# Patient Record
Sex: Male | Born: 1970 | Race: Black or African American | Hispanic: No | Marital: Married | Smoking: Former smoker
Health system: Southern US, Community
[De-identification: ages and names within clinical notes are randomized; demographics above are authoritative.]

## PROBLEM LIST (undated history)

## (undated) DIAGNOSIS — I639 Cerebral infarction, unspecified: Secondary | ICD-10-CM

## (undated) DIAGNOSIS — E785 Hyperlipidemia, unspecified: Secondary | ICD-10-CM

## (undated) HISTORY — DX: Cerebral infarction, unspecified: I63.9

---

## 1999-10-29 ENCOUNTER — Emergency Department (HOSPITAL_COMMUNITY): Admission: EM | Admit: 1999-10-29 | Discharge: 1999-10-29 | Payer: Self-pay | Admitting: Emergency Medicine

## 1999-10-30 ENCOUNTER — Ambulatory Visit (HOSPITAL_COMMUNITY): Admission: RE | Admit: 1999-10-30 | Discharge: 1999-10-30 | Payer: Self-pay | Admitting: Emergency Medicine

## 1999-10-30 ENCOUNTER — Encounter: Payer: Self-pay | Admitting: Emergency Medicine

## 1999-11-08 ENCOUNTER — Ambulatory Visit: Admission: RE | Admit: 1999-11-08 | Discharge: 1999-11-08 | Payer: Self-pay | Admitting: Internal Medicine

## 2013-01-07 ENCOUNTER — Other Ambulatory Visit: Payer: Self-pay | Admitting: Sports Medicine

## 2013-01-07 DIAGNOSIS — M542 Cervicalgia: Secondary | ICD-10-CM

## 2013-01-11 ENCOUNTER — Ambulatory Visit
Admission: RE | Admit: 2013-01-11 | Discharge: 2013-01-11 | Disposition: A | Discharge status: Home / Self Care | Payer: No Typology Code available for payment source | Source: Ambulatory Visit | Attending: Sports Medicine | Admitting: Sports Medicine

## 2013-01-11 DIAGNOSIS — M542 Cervicalgia: Secondary | ICD-10-CM

## 2020-10-16 ENCOUNTER — Emergency Department (HOSPITAL_COMMUNITY): Payer: No Typology Code available for payment source

## 2020-10-16 ENCOUNTER — Encounter (HOSPITAL_COMMUNITY): Payer: Self-pay | Admitting: Emergency Medicine

## 2020-10-16 ENCOUNTER — Emergency Department (HOSPITAL_COMMUNITY)
Admission: EM | Admit: 2020-10-16 | Discharge: 2020-10-16 | Disposition: A | Discharge status: Home / Self Care | Payer: No Typology Code available for payment source | Attending: Emergency Medicine | Admitting: Emergency Medicine

## 2020-10-16 DIAGNOSIS — N451 Epididymitis: Secondary | ICD-10-CM

## 2020-10-16 HISTORY — DX: Hyperlipidemia, unspecified: E78.5

## 2020-10-16 LAB — COMPREHENSIVE METABOLIC PANEL
ALT: 44 U/L (ref 0–44)
AST: 35 U/L (ref 15–41)
Albumin: 3.8 g/dL (ref 3.5–5.0)
Alkaline Phosphatase: 113 U/L (ref 38–126)
Anion gap: 12 (ref 5–15)
BUN: 11 mg/dL (ref 6–20)
CO2: 23 mmol/L (ref 22–32)
Calcium: 9.4 mg/dL (ref 8.9–10.3)
Chloride: 100 mmol/L (ref 98–111)
Creatinine, Ser: 1.16 mg/dL (ref 0.61–1.24)
GFR, Estimated: >60 mL/min (ref >60)
Glucose, Bld: 111 mg/dL — ABNORMAL HIGH (ref 70–99)
Potassium: 3.4 mmol/L — ABNORMAL LOW (ref 3.5–5.1)
Sodium: 135 mmol/L (ref 135–145)
Total Bilirubin: 0.7 mg/dL (ref 0.3–1.2)
Total Protein: 7.6 g/dL (ref 6.5–8.1)

## 2020-10-16 LAB — CBC
HCT: 37.9 % — ABNORMAL LOW (ref 39.0–52.0)
Hemoglobin: 13.2 g/dL (ref 13.0–17.0)
MCH: 30.8 pg (ref 26.0–34.0)
MCHC: 34.8 g/dL (ref 30.0–36.0)
MCV: 88.3 fL (ref 80.0–100.0)
Platelets: 253 10*3/uL (ref 150–400)
RBC: 4.29 MIL/uL (ref 4.22–5.81)
RDW: 13.1 % (ref 11.5–15.5)
WBC: 8.9 10*3/uL (ref 4.0–10.5)
nRBC: 0 % (ref 0.0–0.2)

## 2020-10-16 MED ORDER — NAPROXEN 500 MG PO TABS: 500.0000 mg | ORAL_TABLET | Freq: Two times a day (BID) | ORAL | 0 refills | Status: AC

## 2020-10-16 MED ORDER — LEVOFLOXACIN 500 MG PO TABS: 500.0000 mg | ORAL_TABLET | Freq: Every day | ORAL | 0 refills | Status: DC

## 2020-10-16 NOTE — ED Provider Notes (Signed)
[facility] EMERGENCY DEPARTMENT Provider Note   CSN: [id number] Arrival date & time: 10/16/20  1000     History Chief Complaint  Patient presents with  . Testicle Pain    Spencer Turner is a 50 y.o. male with no pertinent past medical to the presents the emerge department today for testicle pain.  Patient states that he was diagnosed with epididymitis at [facility] on Friday, 4 days ago after he realized he had a left testicle pain and left testicle swelling that day.  He states that they checked him for STDs which were negative according to pt, was discharged on doxycycline for epididymitis. Patient states that he had 5 doses of his doxycycline, however the pain and swelling have not improved, still having severe pain in his left testicle.  States that they gave him pain medication to take during the day which has been helping, however the swelling has remained the same.  Denies any fevers, chills, nausea, vomiting, abdominal pain.  Pain does not radiate anywhere.  Denies any dysuria or hematuria.  Denies any penile discharge or lesions.  Denies any trauma to the area.  HPI     Past Medical History:  Diagnosis Date  . Hyperlipemia     There are no problems to display for this patient.     No family history on file.     Home Medications Prior to Admission medications   Medication Sig Start Date End Date Taking? Authorizing Provider  levofloxacin (LEVAQUIN) 500 MG tablet Take 1 tablet (500 mg total) by mouth daily. 10/16/20  Yes Farrel Gordon, PA-C  naproxen (NAPROSYN) 500 MG tablet Take 1 tablet (500 mg total) by mouth 2 (two) times daily. 10/16/20  Yes Farrel Gordon, PA-C    Allergies    Patient has no known allergies.  Review of Systems   Review of Systems  Constitutional: Negative for chills, diaphoresis, fatigue and fever.  HENT: Negative for congestion, sore throat and trouble swallowing.   Eyes: Negative for pain and visual disturbance.   Respiratory: Negative for cough, shortness of breath and wheezing.   Cardiovascular: Negative for chest pain, palpitations and leg swelling.  Gastrointestinal: Negative for abdominal distention, abdominal pain, diarrhea, nausea and vomiting.  Genitourinary: Positive for scrotal swelling and testicular pain. Negative for difficulty urinating, flank pain, hematuria, penile discharge, penile pain and penile swelling.  Musculoskeletal: Negative for back pain, neck pain and neck stiffness.  Skin: Negative for pallor.  Neurological: Negative for dizziness, speech difficulty, weakness and headaches.  Psychiatric/Behavioral: Negative for confusion.    Physical Exam Updated Vital Signs BP (!) 143/106 (BP Location: Left Arm)   Pulse 71   Temp 98.5 F (36.9 C)   Resp 14   SpO2 91%   Physical Exam Exam conducted with a chaperone present.  Constitutional:      General: He is not in acute distress.    Appearance: Normal appearance. He is not ill-appearing, toxic-appearing or diaphoretic.  HENT:     Mouth/Throat:     Mouth: Mucous membranes are moist.     Pharynx: Oropharynx is clear.  Eyes:     General: No scleral icterus.    Extraocular Movements: Extraocular movements intact.     Pupils: Pupils are equal, round, and reactive to light.  Cardiovascular:     Rate and Rhythm: Normal rate and regular rhythm.     Pulses: Normal pulses.     Heart sounds: Normal heart sounds.  Pulmonary:     Effort:  Pulmonary effort is normal. No respiratory distress.     Breath sounds: Normal breath sounds. No stridor. No wheezing, rhonchi or rales.  Chest:     Chest wall: No tenderness.  Abdominal:     General: Abdomen is flat. There is no distension.     Palpations: Abdomen is soft.     Tenderness: There is no abdominal tenderness. There is no guarding or rebound.     Hernia: There is no hernia in the left inguinal area or right inguinal area.  Genitourinary:    Penis: Normal.      Testes:  Cremasteric reflex is present.        Left: Tenderness and swelling present.     Epididymis:     Right: Normal.     Left: Tenderness present.       Comments: Left testicle is swollen and 3 times the size of his right testicle.  Tenderness to palpation diffusely on left testicle, worse at epididymis.  No erythema.  Cremaster reflex present. Musculoskeletal:        General: No swelling or tenderness. Normal range of motion.     Cervical back: Normal range of motion and neck supple. No rigidity.     Right lower leg: No edema.     Left lower leg: No edema.  Lymphadenopathy:     Lower Body: No right inguinal adenopathy. No left inguinal adenopathy.  Skin:    General: Skin is warm and dry.     Capillary Refill: Capillary refill takes less than 2 seconds.     Coloration: Skin is not pale.  Neurological:     General: No focal deficit present.     Mental Status: He is alert and oriented to person, place, and time.  Psychiatric:        Mood and Affect: Mood normal.        Behavior: Behavior normal.     ED Results / Procedures / Treatments   Labs (all labs ordered are listed, but only abnormal results are displayed) Labs Reviewed  CBC - Abnormal; Notable for the following components:      Result Value   HCT 37.9 (*)    All other components within normal limits  COMPREHENSIVE METABOLIC PANEL - Abnormal; Notable for the following components:   Potassium 3.4 (*)    Glucose, Bld 111 (*)    All other components within normal limits    EKG None  Radiology US SCROTUM W/DOPPLER  Result Date: 10/16/2020 CLINICAL DATA:  Left scrotal pain for 3 days.  No reported injury. EXAM: SCROTAL ULTRASOUND DOPPLER ULTRASOUND OF THE TESTICLES TECHNIQUE: Complete ultrasound examination of the testicles, epididymis, and other scrotal structures was performed. Color and spectral Doppler ultrasound were also utilized to evaluate blood flow to the testicles. COMPARISON:  None. FINDINGS: Right testicle  Measurements: 4.5 x 2.5 x 3.4 cm. No mass or microlithiasis visualized. Left testicle Measurements: 4.3 x 2.7 x 3.9 cm. No mass or microlithiasis visualized. Right epididymis:  Normal in size and appearance. Left epididymis: Diffusely thickened, heterogeneous and hypervascular left epididymis compatible with acute left epididymitis. No discrete abscess. Hydrocele: Small to moderate left hydrocele with a few thin internal septations. Varicocele:  None visualized. Pulsed Doppler interrogation of both testes demonstrates normal low resistance arterial and venous waveforms bilaterally. IMPRESSION: 1. Prominent acute left epididymitis. Small to moderate left hydrocele with a few thin internal septations. 2. Normal testes.  No testicular masses.  No testicular torsion. Electronically Signed   By: Barbara Cower  A Poff M.D.   On: 10/16/2020 12:49    Procedures Procedures   Medications Ordered in ED Medications - No data to display  ED Course  I have reviewed the triage vital signs and the nursing notes.  Pertinent labs & imaging results that were available during my care of the patient were reviewed by me and considered in my medical decision making (see chart for details).    MDM Rules/Calculators/A&P                         Donat TIMMY BUBECK is a 50 y.o. male with no pertinent past medical to the presents the emerge department today for testicle pain.Ultrasound from [facility] shows normal testicle size on the right and left sides, however on my exam left testicle looks 3 times a size is right.  This could be consistent with variceal, however since there is no improvement with doxycycline I think is reasonable to get another ultrasound at this time to assess for worsening disease.  No concerns of cellulitis.  Did not palpate abscess.  Work-up today without any leukocytosis, afebrile.  Ultrasound does show acute left epididymitis, did speak to Jonny Ruiz, pharmacist who recommends changing patient's medications and  Levaquin at this time.  Patient will follow up with urology.  Patient agreeable with plan.  Doubt need for further emergent work up at this time. I explained the diagnosis and have given explicit precautions to return to the ER including for any other new or worsening symptoms. The patient understands and accepts the medical plan as it's been dictated and I have answered their questions. Discharge instructions concerning home care and prescriptions have been given. The patient is STABLE and is discharged to home in good condition.   Final Clinical Impression(s) / ED Diagnoses Final diagnoses:  Epididymitis    Rx / DC Orders ED Discharge Orders         Ordered    levofloxacin (LEVAQUIN) 500 MG tablet  Daily        10/16/20 1318    naproxen (NAPROSYN) 500 MG tablet  2 times daily        10/16/20 1319           Farrel Gordon, PA-C 10/16/20 1600    Alvira Monday, MD 10/18/20 [phone]

## 2020-10-16 NOTE — ED Triage Notes (Addendum)
Pt began having left testicle pain and swelling on Friday 3/19 he was seen at [facility] had a [place] was diagnosed with epididymis and started on doxy. He is here today because pain or swelling has not improved.

## 2020-10-16 NOTE — Discharge Instructions (Signed)
Please discontinue taking her doxycycline, start taking your Levaquin. If you have any new or worsening concerning symptoms is back to the emergency department.  As we discussed I want you to follow-up with your urologist.  Please see the attached directions.  You can take naproxen as directed on the bottle for pain and anti-inflammatory.

## 2020-10-18 ENCOUNTER — Ambulatory Visit (INDEPENDENT_AMBULATORY_CARE_PROVIDER_SITE_OTHER): Payer: Commercial Managed Care - PPO | Admitting: Urology

## 2020-10-18 ENCOUNTER — Encounter: Payer: Self-pay | Admitting: Urology

## 2020-10-18 ENCOUNTER — Other Ambulatory Visit: Payer: Self-pay

## 2020-10-18 VITALS — BP 125/81 | HR 99 | Ht 70.0 in | Wt 255.0 lb

## 2020-10-18 DIAGNOSIS — N50819 Testicular pain, unspecified: Secondary | ICD-10-CM | POA: Diagnosis not present

## 2020-10-18 NOTE — Progress Notes (Signed)
   10/18/2020 9:36 AM   Spencer Turner 1971-03-28 [id number]  Referring provider: Nathaneil Canary, PA-C [address] [place],  [place] [address]  Chief Complaint  Patient presents with  . Other    HPI: Spencer Turner is a 50 y.o. male who presents for follow-up of a recent ED visit for left epididymitis.   Onset of left hemiscrotal pain and swelling 10/13/2020  Was seen at [facility] ED in [place]  Negative UA and STI testing  Scrotal sonogram consistent with left epididymitis  Received IM Rocephin and discharged on 10-day course of doxycycline  Went to [facility] ED 10/16/2020 for persistent pain and swelling  Exam remarkable for left testis 3 times normal size  Scrotal ultrasound repeated which showed diffusely thickened and hypervascular left epididymis; no evidence of abscess  Antibiotic was changed to Levaquin  No bothersome LUTS  No prior history epididymitis  States his pain has significantly improved over the last 48 hours   PMH: Past Medical History:  Diagnosis Date  . Hyperlipemia     Surgical History: . Fracture surgery Left ankle  . Tonsillectomy    Home Medications:  Allergies as of 10/18/2020   No Known Allergies     Medication List       Accurate as of October 18, 2020  9:36 AM. If you have any questions, ask your nurse or doctor.        levofloxacin 500 MG tablet Commonly known as: LEVAQUIN Take 1 tablet (500 mg total) by mouth daily.   naproxen 500 MG tablet Commonly known as: NAPROSYN Take 1 tablet (500 mg total) by mouth 2 (two) times daily.       Allergies: No Known Allergies  Family History: No family history on file.  Social History:  reports that he has been smoking. He has never used smokeless tobacco. He reports current alcohol use. No history on file for drug use.   Physical Exam: BP 125/81   Pulse 99   Ht 5\' 10"  (1.778 m)   Wt 255 lb (115.7 kg)   BMI 36.59 kg/m   Constitutional:  Alert  and oriented, No acute distress. HEENT: [place] AT, moist mucus membranes.  Trachea midline, no masses. Cardiovascular: No clubbing, cyanosis, or edema. Respiratory: Normal respiratory effort, no increased work of breathing. GU: Phallus without lesions, left hemiscrotum enlarged without erythema or fluctuance enlargement induration left epididymis and testis; minimal tenderness Skin: No rashes, bruises or suspicious lesions. Neurologic: Grossly intact, no focal deficits, moving all 4 extremities. Psychiatric: Normal mood and affect.  Laboratory Data:  Urinalysis Dipstick/microscopy negative  Pertinent Imaging: Scrotal ultrasound images 10/16/2020 personally reviewed and interpreted  Assessment & Plan:    1.  Left epididymo-orchitis  Significant symptom improvement past 48 hours  No evidence scrotal abscess  Complete Levaquin course  Will treat with an additional 10-day course Septra DS after Levaquin completed  Follow-up 6-8 weeks for reexam   10/18/2020, MD  [facility] [address], [address] [place], [place] [phone]

## 2020-10-19 LAB — URINALYSIS, COMPLETE
Bilirubin, UA: NEGATIVE
Glucose, UA: NEGATIVE
Ketones, UA: NEGATIVE
Leukocytes,UA: NEGATIVE
Nitrite, UA: NEGATIVE
Protein,UA: NEGATIVE
RBC, UA: NEGATIVE
Specific Gravity, UA: >1.03 — ABNORMAL HIGH (ref 1.005–1.030)
Urobilinogen, Ur: 1 mg/dL (ref 0.2–1.0)
pH, UA: 5 (ref 5.0–7.5)

## 2020-10-19 LAB — MICROSCOPIC EXAMINATION: Bacteria, UA: NONE SEEN

## 2020-10-22 MED ORDER — SULFAMETHOXAZOLE-TRIMETHOPRIM 800-160 MG PO TABS: 1.0000 | ORAL_TABLET | Freq: Two times a day (BID) | ORAL | 0 refills | Status: AC

## 2020-12-11 ENCOUNTER — Ambulatory Visit: Payer: Self-pay | Admitting: Urology

## 2020-12-12 ENCOUNTER — Encounter: Payer: Self-pay | Admitting: Urology

## 2022-04-03 IMAGING — US US SCROTUM W/ DOPPLER COMPLETE
1 series · 13 of 25 positions shown · non-contrast
Comparison: None.

CLINICAL DATA: Left scrotal pain for 3 days.  No reported injury.

EXAM:
SCROTAL ULTRASOUND
DOPPLER ULTRASOUND OF THE TESTICLES
TECHNIQUE: Complete ultrasound examination of the testicles, epididymis, and
other scrotal structures was performed. Color and spectral Doppler
ultrasound were also utilized to evaluate blood flow to the
testicles.

[Series 1: us scrotum w/doppler · 13 of 65 slices shown]
[im 1/65]
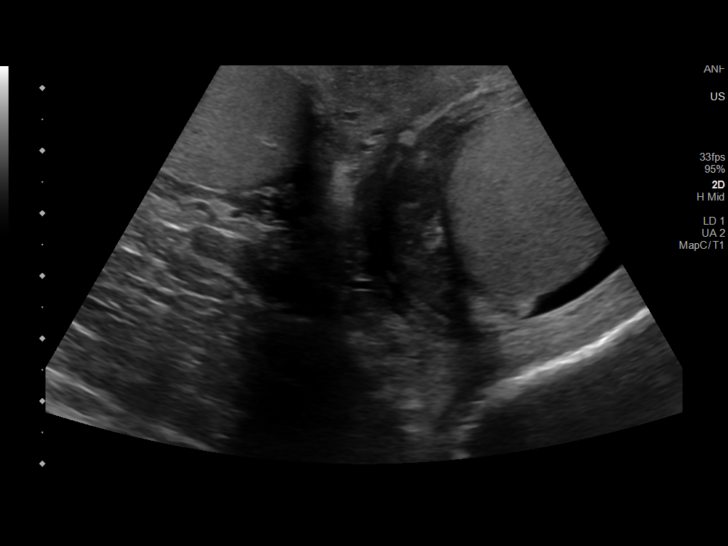
[im 6/65]
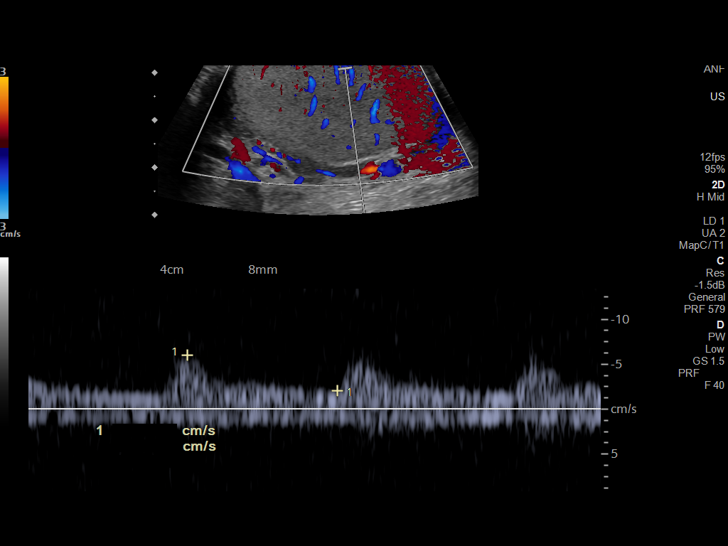
[im 11/65]
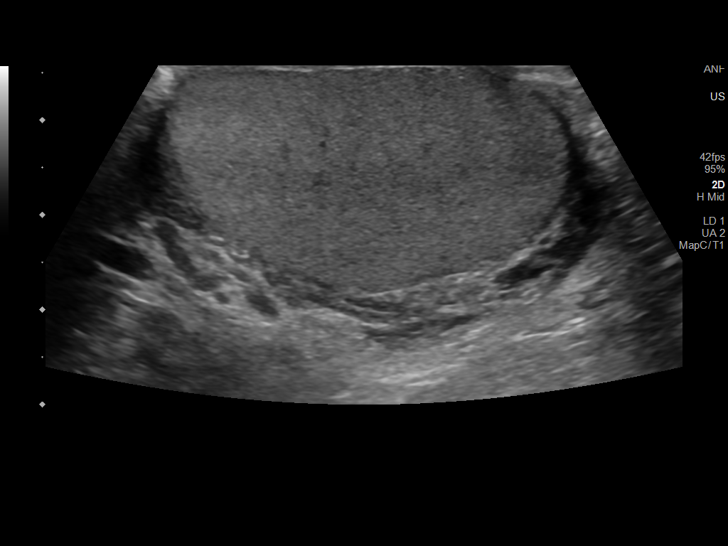
[im 17/65]
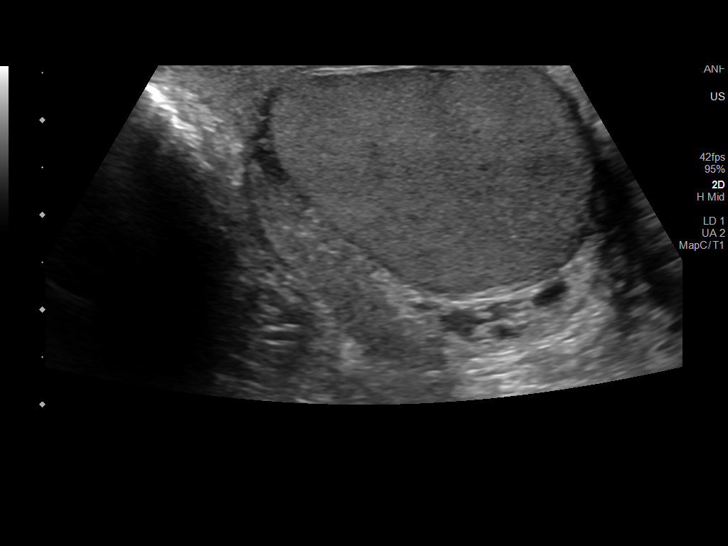
[im 22/65]
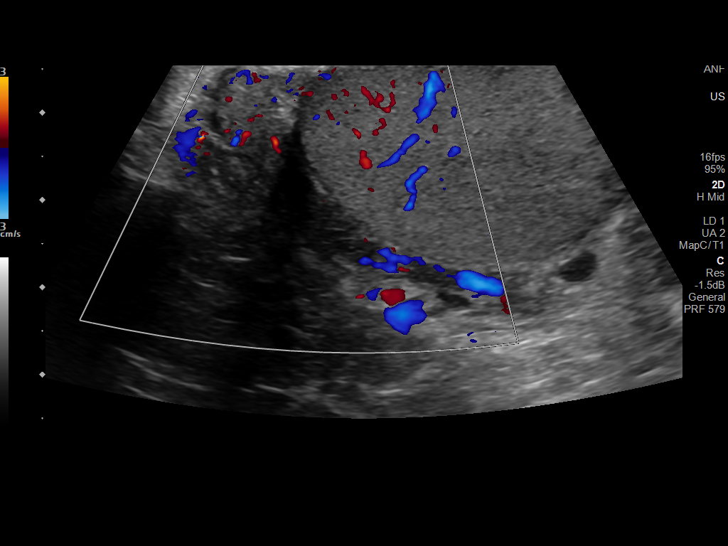
[im 27/65]
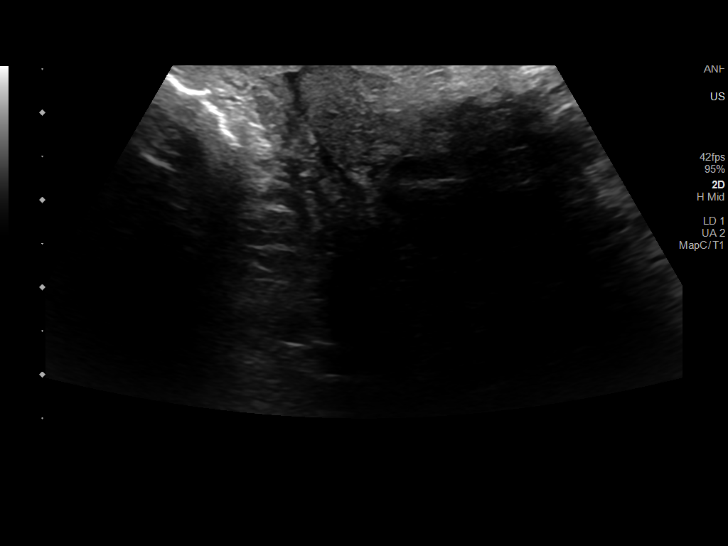
[im 33/65]
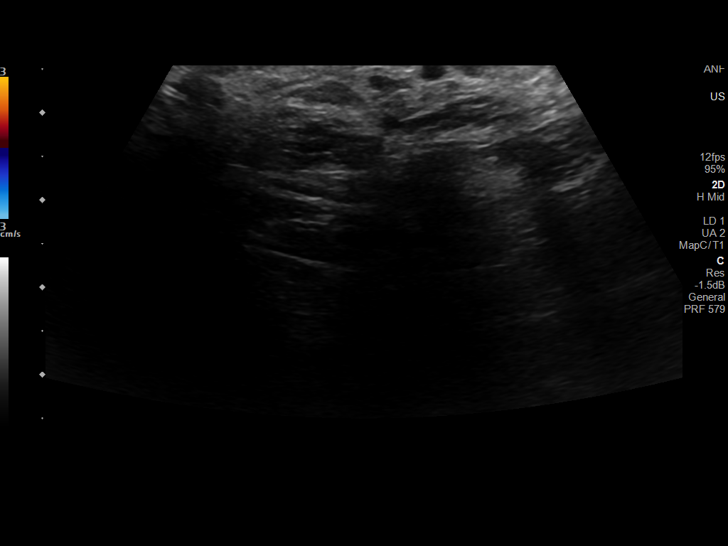
[im 38/65]
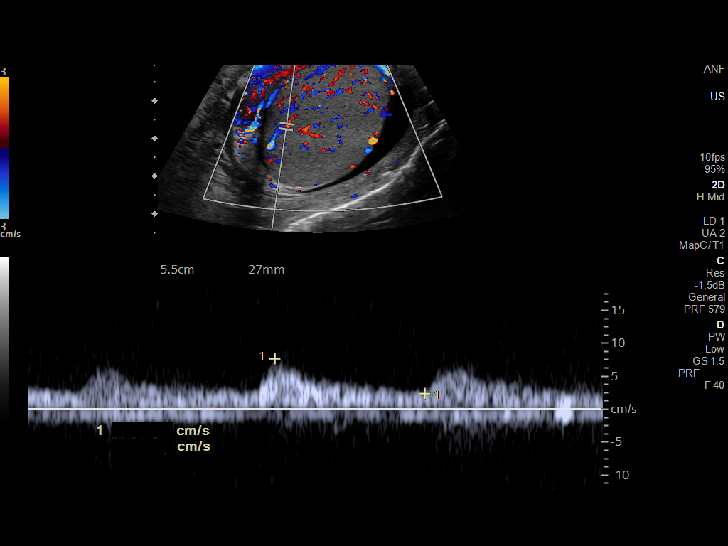
[im 43/65]
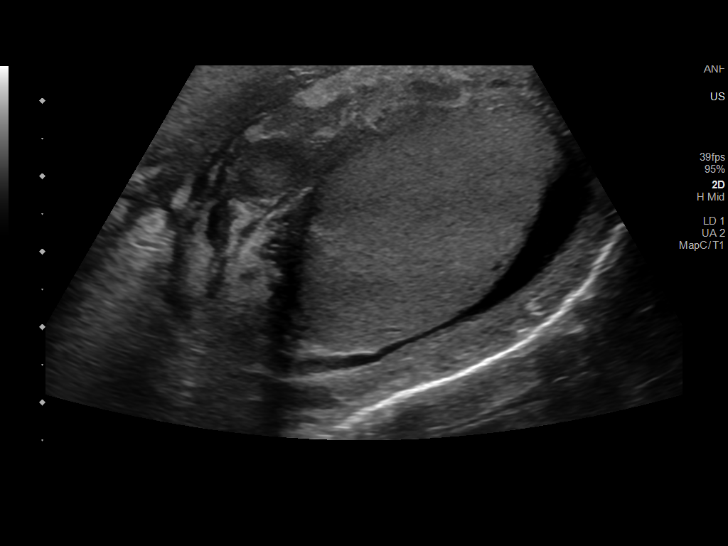
[im 49/65]
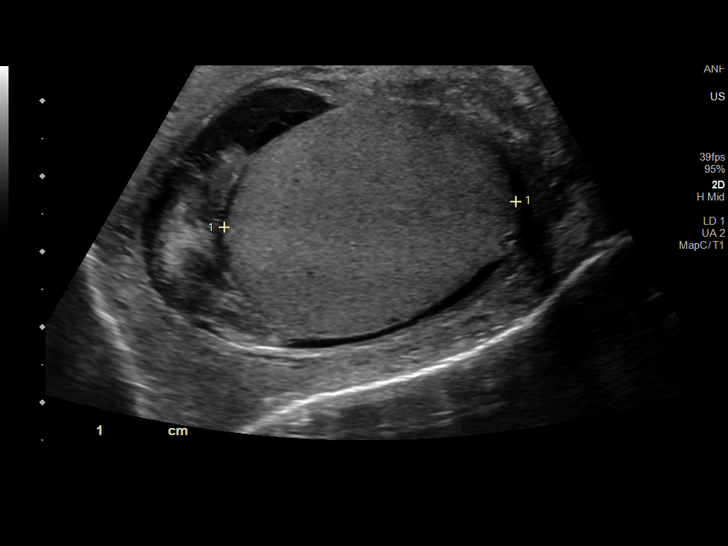
[im 54/65]
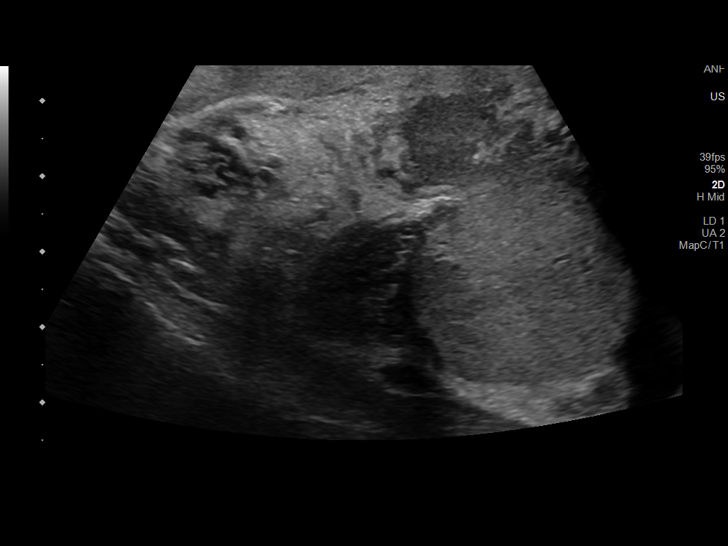
[im 59/65]
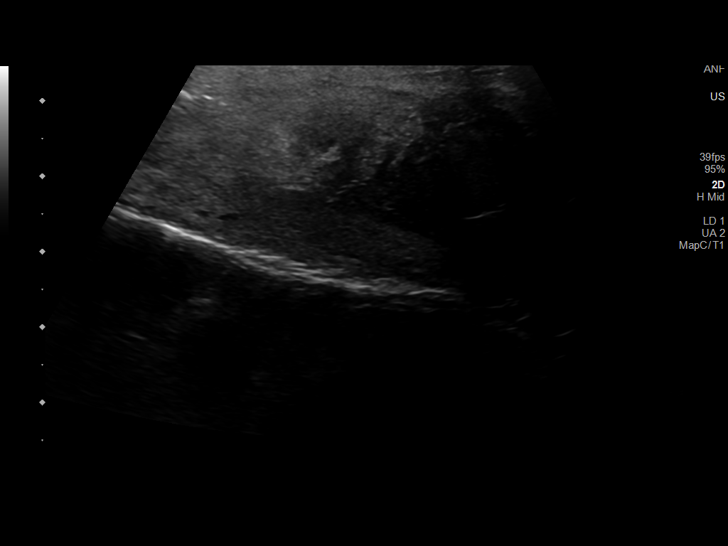
[im 65/65]
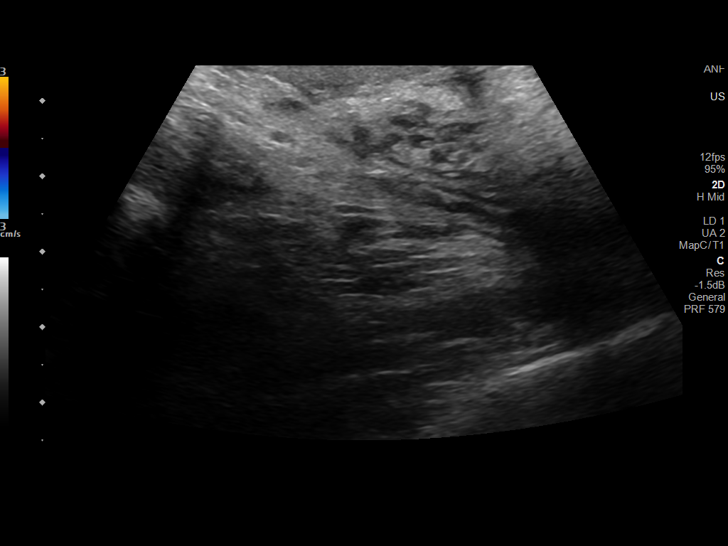

[13 of 25 positions shown; findings below may reference images not displayed]

FINDINGS: Right testicle

Measurements: 4.5 x 2.5 x 3.4 cm. No mass or microlithiasis
visualized.

Left testicle

Measurements: 4.3 x 2.7 x 3.9 cm. No mass or microlithiasis
visualized.

Right epididymis:  Normal in size and appearance.

Left epididymis: Diffusely thickened, heterogeneous and
hypervascular left epididymis compatible with acute left
epididymitis. No discrete abscess.

Hydrocele: Small to moderate left hydrocele with a few thin internal
septations.

Varicocele:  None visualized.

Pulsed Doppler interrogation of both testes demonstrates normal low
resistance arterial and venous waveforms bilaterally.
IMPRESSION: 1. Prominent acute left epididymitis. Small to moderate left
hydrocele with a few thin internal septations.
2. Normal testes.  No testicular masses.  No testicular torsion.

## 2024-01-31 ENCOUNTER — Ambulatory Visit
Admission: EM | Admit: 2024-01-31 | Discharge: 2024-01-31 | Disposition: A | Discharge status: Other Acute Inpatient Hospital | Attending: Family Medicine | Admitting: Family Medicine

## 2024-01-31 ENCOUNTER — Other Ambulatory Visit: Payer: Self-pay

## 2024-01-31 ENCOUNTER — Emergency Department (HOSPITAL_COMMUNITY): Payer: Self-pay

## 2024-01-31 ENCOUNTER — Encounter: Payer: Self-pay | Admitting: Emergency Medicine

## 2024-01-31 ENCOUNTER — Encounter (HOSPITAL_COMMUNITY): Payer: Self-pay | Admitting: *Deleted

## 2024-01-31 ENCOUNTER — Emergency Department (HOSPITAL_COMMUNITY)
Admission: EM | Admit: 2024-01-31 | Discharge: 2024-01-31 | Disposition: A | Discharge status: Home / Self Care | Payer: Self-pay

## 2024-01-31 DIAGNOSIS — R42 Dizziness and giddiness: Secondary | ICD-10-CM | POA: Insufficient documentation

## 2024-01-31 DIAGNOSIS — R519 Headache, unspecified: Secondary | ICD-10-CM

## 2024-01-31 DIAGNOSIS — I7774 Dissection of vertebral artery: Secondary | ICD-10-CM

## 2024-01-31 DIAGNOSIS — S0990XA Unspecified injury of head, initial encounter: Secondary | ICD-10-CM

## 2024-01-31 DIAGNOSIS — M542 Cervicalgia: Secondary | ICD-10-CM | POA: Diagnosis not present

## 2024-01-31 DIAGNOSIS — I10 Essential (primary) hypertension: Secondary | ICD-10-CM | POA: Insufficient documentation

## 2024-01-31 DIAGNOSIS — R55 Syncope and collapse: Secondary | ICD-10-CM | POA: Diagnosis present

## 2024-01-31 LAB — COMPREHENSIVE METABOLIC PANEL WITH GFR
ALT: 32 U/L (ref 0–44)
AST: 24 U/L (ref 15–41)
Albumin: 3.8 g/dL (ref 3.5–5.0)
Alkaline Phosphatase: 81 U/L (ref 38–126)
Anion gap: 10 (ref 5–15)
BUN: 11 mg/dL (ref 6–20)
CO2: 24 mmol/L (ref 22–32)
Calcium: 9.2 mg/dL (ref 8.9–10.3)
Chloride: 106 mmol/L (ref 98–111)
Creatinine, Ser: 1.12 mg/dL (ref 0.61–1.24)
GFR, Estimated: >60 mL/min (ref >60)
Glucose, Bld: 99 mg/dL (ref 70–99)
Potassium: 3.9 mmol/L (ref 3.5–5.1)
Sodium: 140 mmol/L (ref 135–145)
Total Bilirubin: 0.8 mg/dL (ref 0.0–1.2)
Total Protein: 6.6 g/dL (ref 6.5–8.1)

## 2024-01-31 LAB — CBC WITH DIFFERENTIAL/PLATELET
Abs Immature Granulocytes: 0.01 K/uL (ref 0.00–0.07)
Basophils Absolute: 0 K/uL (ref 0.0–0.1)
Basophils Relative: 0 %
Eosinophils Absolute: 0.1 K/uL (ref 0.0–0.5)
Eosinophils Relative: 2 %
HCT: 38.9 % — ABNORMAL LOW (ref 39.0–52.0)
Hemoglobin: 13 g/dL (ref 13.0–17.0)
Immature Granulocytes: 0 %
Lymphocytes Relative: 37 %
Lymphs Abs: 2.2 K/uL (ref 0.7–4.0)
MCH: 30.7 pg (ref 26.0–34.0)
MCHC: 33.4 g/dL (ref 30.0–36.0)
MCV: 92 fL (ref 80.0–100.0)
Monocytes Absolute: 0.4 K/uL (ref 0.1–1.0)
Monocytes Relative: 7 %
Neutro Abs: 3.2 K/uL (ref 1.7–7.7)
Neutrophils Relative %: 54 %
Platelets: 209 K/uL (ref 150–400)
RBC: 4.23 MIL/uL (ref 4.22–5.81)
RDW: 13.5 % (ref 11.5–15.5)
WBC: 5.9 K/uL (ref 4.0–10.5)
nRBC: 0 % (ref 0.0–0.2)

## 2024-01-31 LAB — TROPONIN I (HIGH SENSITIVITY)
Troponin I (High Sensitivity): <2 ng/L (ref <18)
Troponin I (High Sensitivity): 2 ng/L (ref <18)

## 2024-01-31 MED ORDER — LACTATED RINGERS IV BOLUS
1000.0000 mL | Freq: Once | INTRAVENOUS | Status: AC
  Administered 2024-01-31: 1000 mL via INTRAVENOUS

## 2024-01-31 MED ORDER — ASPIRIN 81 MG PO CHEW
324.0000 mg | CHEWABLE_TABLET | Freq: Once | ORAL | Status: AC
Start: 2024-01-31 — End: 2024-01-31
  Administered 2024-01-31: 324 mg via ORAL
  Filled 2024-01-31: qty 4

## 2024-01-31 MED ORDER — ASPIRIN 81 MG PO TBEC: 81.0000 mg | DELAYED_RELEASE_TABLET | Freq: Every day | ORAL | 0 refills | Status: AC

## 2024-01-31 MED ORDER — CLOPIDOGREL BISULFATE 300 MG PO TABS
300.0000 mg | ORAL_TABLET | Freq: Once | ORAL | Status: AC
  Administered 2024-01-31: 300 mg via ORAL
  Filled 2024-01-31: qty 1

## 2024-01-31 MED ORDER — MECLIZINE HCL 25 MG PO TABS
25.0000 mg | ORAL_TABLET | Freq: Once | ORAL | Status: AC
  Administered 2024-01-31: 25 mg via ORAL
  Filled 2024-01-31: qty 1

## 2024-01-31 MED ORDER — IOHEXOL 350 MG/ML SOLN
75.0000 mL | Freq: Once | INTRAVENOUS | Status: AC | PRN
  Administered 2024-01-31: 75 mL via INTRAVENOUS

## 2024-01-31 MED ORDER — ACETAMINOPHEN ER 650 MG PO TBCR: 650.0000 mg | EXTENDED_RELEASE_TABLET | Freq: Three times a day (TID) | ORAL | 0 refills | Status: AC | PRN

## 2024-01-31 MED ORDER — MORPHINE SULFATE (PF) 4 MG/ML IV SOLN
6.0000 mg | Freq: Once | INTRAVENOUS | Status: AC
  Administered 2024-01-31: 6 mg via INTRAVENOUS
  Filled 2024-01-31: qty 2

## 2024-01-31 MED ORDER — CLOPIDOGREL BISULFATE 75 MG PO TABS: 75.0000 mg | ORAL_TABLET | Freq: Every day | ORAL | 0 refills | Status: DC

## 2024-01-31 NOTE — ED Provider Notes (Addendum)
 [facility]    CSN: [id number] Arrival date & time: 01/31/24  1020      History   Chief Complaint Chief Complaint  Patient presents with   Loss of Consciousness    HPI Spencer Turner is a 53 y.o. male.    Loss of Consciousness Here for syncope and dizziness and blurry/dark vision.  Yesterday during the day he had been relaxing out on his front porch for most of the day.  About 530 he started feeling dizzy and his vision was going dark.  He started walking toward his house and then found himself to have passed out.  He is not sure how long he was unconscious.  He is not sure if he hit his head but since this happened he has had a headache.  Since then he still feels dizzy or fuzzy in his head and he is having times where his vision is blurry.  He has not had any preceding fever or vomiting or diarrhea.  He did not do a bunch of perspiration yesterday.  NKDA  He takes cholesterol medicine and vitamin D.  Past Medical History:  Diagnosis Date   Hyperlipemia     There are no active problems to display for this patient.   History reviewed. No pertinent surgical history.     Home Medications    Prior to Admission medications   Medication Sig Start Date End Date Taking? Authorizing Provider  levofloxacin  (LEVAQUIN ) 500 MG tablet Take 1 tablet (500 mg total) by mouth daily. Patient not taking: Reported on 01/31/2024 10/16/20   Patel, Shalyn, PA-C  naproxen  (NAPROSYN ) 500 MG tablet Take 1 tablet (500 mg total) by mouth 2 (two) times daily. 10/16/20   Tobie Schatz, PA-C    Family History History reviewed. No pertinent family history.  Social History Social History   Tobacco Use   Smoking status: Every Day   Smokeless tobacco: Never  Substance Use Topics   Alcohol use: Yes     Allergies   Patient has no known allergies.   Review of Systems Review of Systems  Cardiovascular:  Positive for syncope.     Physical Exam Triage Vital  Signs ED Triage Vitals  Encounter Vitals Group     BP 01/31/24 1041 (!) 135/92     Girls Systolic BP Percentile --      Girls Diastolic BP Percentile --      Boys Systolic BP Percentile --      Boys Diastolic BP Percentile --      Pulse Rate 01/31/24 1041 63     Resp 01/31/24 1041 18     Temp 01/31/24 1041 98.6 F (37 C)     Temp Source 01/31/24 1041 Oral     SpO2 01/31/24 1041 98 %     Weight --      Height --      Head Circumference --      Peak Flow --      Pain Score 01/31/24 1042 5     Pain Loc --      Pain Education --      Exclude from Growth Chart --    No data found.  Updated Vital Signs BP (!) 135/92 (BP Location: Left Arm)   Pulse 63   Temp 98.6 F (37 C) (Oral)   Resp 18   SpO2 98%   Visual Acuity Right Eye Distance:   Left Eye Distance:   Bilateral Distance:    Right Eye Near:  Left Eye Near:    Bilateral Near:     Physical Exam Vitals reviewed.  Constitutional:      General: He is not in acute distress.    Appearance: He is not toxic-appearing.  HENT:     Right Ear: Tympanic membrane and ear canal normal.     Left Ear: Tympanic membrane and ear canal normal.     Nose: Nose normal.     Mouth/Throat:     Mouth: Mucous membranes are moist.     Pharynx: No oropharyngeal exudate or posterior oropharyngeal erythema.  Eyes:     Extraocular Movements: Extraocular movements intact.     Conjunctiva/sclera: Conjunctivae normal.     Pupils: Pupils are equal, round, and reactive to light.  Cardiovascular:     Rate and Rhythm: Normal rate and regular rhythm.     Heart sounds: No murmur heard. Pulmonary:     Effort: Pulmonary effort is normal.     Breath sounds: Normal breath sounds.  Musculoskeletal:     Cervical back: Neck supple.  Lymphadenopathy:     Cervical: No cervical adenopathy.  Skin:    Capillary Refill: Capillary refill takes less than 2 seconds.     Coloration: Skin is not jaundiced or pale.  Neurological:     General: No focal  deficit present.     Mental Status: He is alert and oriented to person, place, and time.  Psychiatric:        Behavior: Behavior normal.      UC Treatments / Results  Labs (all labs ordered are listed, but only abnormal results are displayed) Labs Reviewed - No data to display  EKG   Radiology CT ANGIO HEAD NECK W WO CM Addendum Date: 01/31/2024 ADDENDUM REPORT: 01/31/2024 14:13 ADDENDUM: These results were called by telephone at the time of interpretation on 01/31/2024 at 2:13 pm to provider TRAVIS YOUNG , who verbally acknowledged these results. Electronically Signed   By: Donnice Mania M.D.   On: 01/31/2024 14:13   Result Date: 01/31/2024 CLINICAL DATA:  AVM/AVF, high flow vascular malformation. Brought in due to syncope and fall last night. Syncopal episode proceeded by lightheadedness and blurry vision. EXAM: CT ANGIOGRAPHY HEAD AND NECK WITH AND WITHOUT CONTRAST TECHNIQUE: Multidetector CT imaging of the head and neck was performed using the standard protocol during bolus administration of intravenous contrast. Multiplanar CT image reconstructions and MIPs were obtained to evaluate the vascular anatomy. Carotid stenosis measurements (when applicable) are obtained utilizing NASCET criteria, using the distal internal carotid diameter as the denominator. RADIATION DOSE REDUCTION: This exam was performed according to the departmental dose-optimization program which includes automated exposure control, adjustment of the mA and/or kV according to patient size and/or use of iterative reconstruction technique. CONTRAST:  75mL OMNIPAQUE  IOHEXOL  350 MG/ML SOLN COMPARISON:  CT head 09/12/2008. FINDINGS: CT HEAD FINDINGS Brain: No acute intracranial hemorrhage. No CT evidence of acute infarct. No edema, mass effect, or midline shift. The basilar cisterns are patent. Ventricles: The ventricles are normal. Vascular: Atherosclerotic calcifications of the carotid siphons. No hyperdense vessel. Skull: No acute  or aggressive finding. Orbits: Orbits are symmetric. Sinuses: Mild mucosal thickening in the left sphenoid sinus. Other: Mastoid air cells are clear. CTA NECK FINDINGS Aortic arch: Standard configuration of the aortic arch. Imaged portion shows no evidence of aneurysm or dissection. No significant stenosis of the major arch vessel origins. Pulmonary arteries: As permitted by contrast timing, there are no filling defects in the visualized pulmonary arteries. Subclavian arteries:  The subclavian arteries are patent bilaterally. Right carotid system: No evidence of dissection, stenosis (50% or greater), or occlusion. Mild atherosclerosis at the carotid bifurcation and along the proximal cervical ICA without significant stenosis. Mild tortuosity of the proximal cervical ICA. Left carotid system: No evidence of dissection, stenosis (50% or greater), or occlusion. Mild atherosclerosis at the carotid bifurcation without significant stenosis. Partial retropharyngeal course of the carotid bulb and proximal cervical ICA. Vertebral arteries: Right vertebral artery is dominant. The right vertebral artery is patent from the origin to the vertebrobasilar confluence. Atherosclerosis along the mid V1 segment resulting in moderate stenosis. The non dominant left vertebral artery is not visualized at the origin and is likely occluded to the mid V2 segment with reconstitution of the vessel at the level of C3-4. There is irregularity and multifocal narrowing of the distal V2 segment with additional occlusion at the level of C1. Reconstitution of the left vertebral artery distal to the origin of the left PICA. Skeleton: No acute findings. Degenerative changes in the cervical spine. Other neck: The visualized airway is patent. No cervical lymphadenopathy. 0.9 cm nodule in the left thyroid lobe. Upper chest: Visualized lung apices are clear. Review of the MIP images confirms the above findings CTA HEAD FINDINGS ANTERIOR CIRCULATION: The  intracranial ICAs are patent bilaterally. Mild atherosclerosis of the carotid siphons. No significant stenosis, proximal occlusion, aneurysm, or vascular malformation. MCAs: The middle cerebral arteries are patent bilaterally. ACAs: The anterior cerebral arteries are patent bilaterally. POSTERIOR CIRCULATION: PCAs: The posterior cerebral arteries are patent bilaterally. Pcomm: Not well visualized. SCAs: The superior cerebellar arteries are patent bilaterally. Basilar artery: Patent AICAs: Not well visualized. PICAs: Patent Vertebral arteries: As above. Venous sinuses: As permitted by contrast timing, patent. 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 Reconstitution of the vessel at this level with multifocal irregularity and additional occlusion at the level of C1-2. 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 Consider MRI for further evaluation. Reconstitution of the left vertebral artery distal to the origin of the left PICA likely via collaterals. Arterial vasculature in the head and neck is otherwise patent. Mild scattered atherosclerosis as above. No CT evidence of acute intracranial abnormality. Electronically Signed: By: Donnice Mania M.D. On: 01/31/2024 14:02   DG Chest 2 View Result Date: 01/31/2024 CLINICAL DATA:  Syncope EXAM: CHEST - 2 VIEW COMPARISON:  None Available. FINDINGS: Lordotic view. Normal mediastinum and cardiac silhouette. Normal pulmonary vasculature. No evidence of effusion, infiltrate, or pneumothorax. No acute bony abnormality. IMPRESSION: No acute cardiopulmonary process. Electronically Signed   By: Jackquline Boxer M.D.   On: 01/31/2024 13:05    Procedures Procedures (including critical care time)  Medications Ordered in UC Medications - No data to display  Initial Impression / Assessment and Plan / UC Course  I  have reviewed the triage vital signs and the nursing notes.  Pertinent labs & imaging results that were available during my care of the patient were reviewed by me and considered in my medical decision making (see chart for details).     EKG is unrevealing except it does show sinus bradycardia with a rate about 57.  I have asked the patient's wife to drive him to the emergency room for further evaluation and treatment that we cannot provide here in the clinic.  She is agreeable.  I think his vital signs  show that he is safe to go by private vehicle to the emergency room. Final Clinical Impressions(s) / UC Diagnoses   Final diagnoses:  Syncope, unspecified syncope type  Closed head injury, initial encounter  Nonintractable headache, unspecified chronicity pattern, unspecified headache type     Discharge Instructions      Pt was driven to the ER by his wife for further evaluation     ED Prescriptions   None    PDMP not reviewed this encounter.   Vonna Sharlet POUR, MD 01/31/24 1118    Vonna Sharlet POUR, MD 01/31/24 [phone]

## 2024-01-31 NOTE — ED Notes (Signed)
 Patient back from MRI.

## 2024-01-31 NOTE — ED Provider Notes (Signed)
 [facility] Provider Note   CSN: [id number] Arrival date & time: 01/31/24  1136     Patient presents with: Fall and Loss of Consciousness   Spencer Turner is a 53 y.o. male.   This is a 53 year old male presenting emergency department for syncope, headache, vertigo.  Reports normal state of health yesterday when around 5 PM was walking into the house got blurred vision, lightheaded and syncopized.  Unsure if he hit his head.  Was not having chest pain or palpitations prior to passing out.  He was found by family members.  Since that time notes that he has had a headache and left-sided neck pain.  Has had some dizziness since that time simile worsened with head movement, but does note some minor dizziness at rest.  Reports no vision loss, but continues to have some blurred vision.  No unilateral weakness.  No balance or gait disturbances.   Fall  Loss of Consciousness      Prior to Admission medications   Medication Sig Start Date End Date Taking? Authorizing Provider  levofloxacin  (LEVAQUIN ) 500 MG tablet Take 1 tablet (500 mg total) by mouth daily. Patient not taking: Reported on 01/31/2024 10/16/20   Patel, Shalyn, PA-C  naproxen  (NAPROSYN ) 500 MG tablet Take 1 tablet (500 mg total) by mouth 2 (two) times daily. 10/16/20   Patel, Shalyn, PA-C    Allergies: Patient has no known allergies.    Review of Systems  Cardiovascular:  Positive for syncope.    Updated Vital Signs BP (!) 136/96   Pulse (!) 58   Temp 98.2 F (36.8 C)   Resp 18   Ht 5' 10 (1.778 m)   Wt 98.4 kg   SpO2 100%   BMI 31.14 kg/m   Physical Exam Vitals and nursing note reviewed.  Constitutional:      General: He is not in acute distress.    Appearance: He is not toxic-appearing.  HENT:     Head: Normocephalic and atraumatic.     Nose: Nose normal.     Mouth/Throat:     Mouth: Mucous membranes are moist.  Eyes:     Conjunctiva/sclera: Conjunctivae  normal.     Pupils: Pupils are equal, round, and reactive to light.  Cardiovascular:     Rate and Rhythm: Normal rate and regular rhythm.  Pulmonary:     Effort: Pulmonary effort is normal.     Breath sounds: Normal breath sounds.  Abdominal:     General: Abdomen is flat. There is no distension.     Tenderness: There is no abdominal tenderness. There is no guarding or rebound.  Musculoskeletal:        General: Normal range of motion.  Skin:    General: Skin is warm and dry.     Capillary Refill: Capillary refill takes less than 2 seconds.  Neurological:     General: No focal deficit present.     Mental Status: He is alert and oriented to person, place, and time.     Cranial Nerves: No cranial nerve deficit.     Sensory: No sensory deficit.     Motor: No weakness.     Coordination: Coordination normal.     Gait: Gait normal.  Psychiatric:        Mood and Affect: Mood normal.        Behavior: Behavior normal.     (all labs ordered are listed, but only abnormal results are displayed)  Labs Reviewed  CBC WITH DIFFERENTIAL/PLATELET - Abnormal; Notable for the following components:      Result Value   HCT 38.9 (*)    All other components within normal limits  COMPREHENSIVE METABOLIC PANEL WITH GFR  TROPONIN I (HIGH SENSITIVITY)  TROPONIN I (HIGH SENSITIVITY)    EKG: EKG Interpretation Date/Time:  Saturday January 31 2024 12:33:17 EDT Ventricular Rate:  57 PR Interval:  167 QRS Duration:  100 QT Interval:  440 QTC Calculation: 429 R Axis:   34  Text Interpretation: Sinus rhythm RSR' in V1 or V2, probably normal variant Confirmed by Neysa Clap [phone]) on 01/31/2024 2:19:58 PM  Radiology: CT ANGIO HEAD NECK W WO CM Addendum Date: 01/31/2024 ADDENDUM REPORT: 01/31/2024 14:13 ADDENDUM: These results were called by telephone at the time of interpretation on 01/31/2024 at 2:13 pm to provider [id number] [facility] , who verbally acknowledged these results. Electronically Signed   By: Donnice Mania M.D.   On: 01/31/2024 14:13   Result Date: 01/31/2024 CLINICAL DATA:  AVM/AVF, high flow vascular malformation. Brought in due to syncope and fall last night. Syncopal episode proceeded by lightheadedness and blurry vision. EXAM: CT ANGIOGRAPHY HEAD AND NECK WITH AND WITHOUT CONTRAST TECHNIQUE: Multidetector CT imaging of the head and neck was performed using the standard protocol during bolus administration of intravenous contrast. Multiplanar CT image reconstructions and MIPs were obtained to evaluate the vascular anatomy. Carotid stenosis measurements (when applicable) are obtained utilizing NASCET criteria, using the distal internal carotid diameter as the denominator. RADIATION DOSE REDUCTION: This exam was performed according to the departmental dose-optimization program which includes automated exposure control, adjustment of the mA and/or kV according to patient size and/or use of iterative reconstruction technique. CONTRAST:  75mL OMNIPAQUE  IOHEXOL  350 MG/ML SOLN COMPARISON:  CT head 09/12/2008. FINDINGS: CT HEAD FINDINGS Brain: No acute intracranial hemorrhage. No CT evidence of acute infarct. No edema, mass effect, or midline shift. The basilar cisterns are patent. Ventricles: The ventricles are normal. Vascular: Atherosclerotic calcifications of the carotid siphons. No hyperdense vessel. Skull: No acute or aggressive finding. Orbits: Orbits are symmetric. Sinuses: Mild mucosal thickening in the left sphenoid sinus. Other: Mastoid air cells are clear. CTA NECK FINDINGS Aortic arch: Standard configuration of the aortic arch. Imaged portion shows no evidence of aneurysm or dissection. No significant stenosis of the major arch vessel origins. Pulmonary arteries: As permitted by contrast timing, there are no filling defects in the visualized pulmonary arteries. Subclavian arteries: The subclavian arteries are patent bilaterally. Right carotid system: No evidence of dissection, stenosis (50% or  greater), or occlusion. Mild atherosclerosis at the carotid bifurcation and along the proximal cervical ICA without significant stenosis. Mild tortuosity of the proximal cervical ICA. Left carotid system: No evidence of dissection, stenosis (50% or greater), or occlusion. Mild atherosclerosis at the carotid bifurcation without significant stenosis. Partial retropharyngeal course of the carotid bulb and proximal cervical ICA. Vertebral arteries: Right vertebral artery is dominant. The right vertebral artery is patent from the origin to the vertebrobasilar confluence. Atherosclerosis along the mid V1 segment resulting in moderate stenosis. The non dominant left vertebral artery is not visualized at the origin and is likely occluded to the mid V2 segment with reconstitution of the vessel at the level of C3-4. There is irregularity and multifocal narrowing of the distal V2 segment with additional occlusion at the level of C1. Reconstitution of the left vertebral artery distal to the origin of the left PICA. Skeleton: No acute findings. Degenerative changes in the  cervical spine. Other neck: The visualized airway is patent. No cervical lymphadenopathy. 0.9 cm nodule in the left thyroid lobe. Upper chest: Visualized lung apices are clear. Review of the MIP images confirms the above findings CTA HEAD FINDINGS ANTERIOR CIRCULATION: The intracranial ICAs are patent bilaterally. Mild atherosclerosis of the carotid siphons. No significant stenosis, proximal occlusion, aneurysm, or vascular malformation. MCAs: The middle cerebral arteries are patent bilaterally. ACAs: The anterior cerebral arteries are patent bilaterally. POSTERIOR CIRCULATION: PCAs: The posterior cerebral arteries are patent bilaterally. Pcomm: Not well visualized. SCAs: The superior cerebellar arteries are patent bilaterally. Basilar artery: Patent AICAs: Not well visualized. PICAs: Patent Vertebral arteries: As above. Venous sinuses: As permitted by contrast  timing, patent. 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 Reconstitution of the vessel at this level with multifocal irregularity and additional occlusion at the level of C1-2. 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 Consider MRI for further evaluation. Reconstitution of the left vertebral artery distal to the origin of the left PICA likely via collaterals. Arterial vasculature in the head and neck is otherwise patent. Mild scattered atherosclerosis as above. No CT evidence of acute intracranial abnormality. Electronically Signed: By: Donnice Mania M.D. On: 01/31/2024 14:02   DG Chest 2 View Result Date: 01/31/2024 CLINICAL DATA:  Syncope EXAM: CHEST - 2 VIEW COMPARISON:  None Available. FINDINGS: Lordotic view. Normal mediastinum and cardiac silhouette. Normal pulmonary vasculature. No evidence of effusion, infiltrate, or pneumothorax. No acute bony abnormality. IMPRESSION: No acute cardiopulmonary process. Electronically Signed   By: Jackquline Boxer M.D.   On: 01/31/2024 13:05     .Critical Care  Performed by: Neysa Caron PARAS, DO Authorized by: Neysa Caron PARAS, DO   Critical care provider statement:    Critical care time (minutes):  30   Critical care was necessary to treat or prevent imminent or life-threatening deterioration of the following conditions:  CNS failure or compromise   Critical care was time spent personally by me on the following activities:  Development of treatment plan with patient or surrogate, discussions with consultants, evaluation of patient's response to treatment, examination of patient, ordering and review of laboratory studies, ordering and review of radiographic studies, ordering and performing treatments and interventions, pulse oximetry, re-evaluation of patient's condition and review of old charts     Medications Ordered in the ED  lactated ringers  bolus 1,000 mL (0 mLs Intravenous Stopped 01/31/24 1518)  meclizine  (ANTIVERT ) tablet 25 mg (25 mg Oral Given 01/31/24 1241)  iohexol  (OMNIPAQUE ) 350 MG/ML injection 75 mL (75 mLs Intravenous Contrast Given 01/31/24 1339)                                    Medical Decision Making 53 year old male history of hypertension hyperlipidemia presenting emergency department after a fall.  He is afebrile, hemodynamically stable.  Normal sinus rhythm on the monitor as interpreted by me.  Physical exam without localizing neurodeficits.  Given his syncope and neurosymptoms obtain CTA, questionable dissection.  His troponin negative.  EKG appears to be normal sinus rhythm without ST segment changes to indicate ischemia my depend interpretation.  No metabolic derangements to explain his symptoms.  Normal kidney function.  No transaminitis to suggest hepato-bili disease.  No leukocytosis to suggest systemic infection.  No anemia.  MRI ordered at the recommendation of radiology.  Care signed out to afternoon team.  Amount and/or Complexity of Data Reviewed Independent Historian:     Details: Family member notes patient at baseline mentation and that he was normal yesterday. External Data Reviewed:     Details: Not on blood thinner per chart review Labs: ordered. Decision-making details documented in ED Course. Radiology: ordered and independent interpretation performed.    Details: I do not appreciate obvious intracranial hemorrhage. ECG/medicine tests: ordered.  Risk Prescription drug management. Decision regarding hospitalization. Diagnosis or treatment significantly limited by social determinants of health. Risk Details: Poor health emergency      Final diagnoses:  None    ED Discharge Orders     None          Neysa Caron PARAS, DO 01/31/24 1523

## 2024-01-31 NOTE — Discharge Instructions (Addendum)
 You were seen in the ER for near fainting episode yesterday along with headache, neck pain.  You informed us  that currently you are not dizzy anymore, but continued to have some headache/neck pain.  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  Please read the instructions provided on this diagnosis.  This diagnosis does increase your risk of having adverse events like stroke.  Start taking aspirin  and Plavix  every day as prescribed.  We have placed a referral to neurologist.  You need to be seen by them, ideally within 2 weeks for a follow-up.  Return to the ER immediately if you start having severe headache, neck pain, vision loss, difficulty with speech, difficulty with balance, one-sided weakness/numbness or confusion.

## 2024-01-31 NOTE — Discharge Instructions (Signed)
 Pt was driven to the ER by his wife for further evaluation

## 2024-01-31 NOTE — ED Notes (Signed)
   01/31/24 1422  Vitals  Patient Position (if appropriate) Orthostatic Vitals  Orthostatic Lying   BP- Lying (!) 128/92  Pulse- Lying 55  Orthostatic Sitting  BP- Sitting (!) 135/104  Pulse- Sitting 66  Orthostatic Standing at 0 minutes  BP- Standing at 0 minutes (!) 132/94  Pulse- Standing at 0 minutes 63  Orthostatic Standing at 3 minutes  BP- Standing at 3 minutes (!) 132/97  Pulse- Standing at 3 minutes 61

## 2024-01-31 NOTE — ED Notes (Signed)
 Patient is being discharged from the Urgent Care and sent to the Emergency Department via POV . Per PB, patient is in need of higher level of care due to syncope. Patient is aware and verbalizes understanding of plan of care.  Vitals:   01/31/24 1041  BP: (!) 135/92  Pulse: 63  Resp: 18  Temp: 98.6 F (37 C)  SpO2: 98%

## 2024-01-31 NOTE — ED Triage Notes (Signed)
 BIB family from home for syncope and fall last night around 1700. Syncope and fall preceded by light headedness, blurry and vision. Denies recent illness, NVD, fever, sob or cough. No sick contacts. No blood thinners. Endorses HA and neck pain r/t fall. Rates pain 8/10. Alert, NAD, calm, interactive.

## 2024-01-31 NOTE — ED Triage Notes (Signed)
 Pt here for LOC yesterday with fall onto concrete; unsure if hit his head; pt sts not feeling well today and HA

## 2024-01-31 NOTE — ED Notes (Signed)
 Patient transported to MRI

## 2024-01-31 NOTE — ED Provider Notes (Signed)
  Physical Exam  BP (!) 136/96   Pulse (!) 58   Temp 98.2 F (36.8 C)   Resp 18   Ht 5' 10 (1.778 m)   Wt 98.4 kg   SpO2 100%   BMI 31.14 kg/m   Physical Exam  Procedures  Procedures  ED Course / MDM    Medical Decision Making Amount and/or Complexity of Data Reviewed Labs: ordered. Radiology: ordered. ECG/medicine tests: ordered.  Risk OTC drugs. Prescription drug management.   Assuming care of patient from Dr. Neysa.   Patient in the ED for syncope y'day and he had a prodrome of dizziness/vertigo and vision change. + h/a and neck pain. Workup thus far shows CT-A with some findings concerning for dissection. Neuro exam - reassuring.  Concerning findings are as following - CT-A. Important pending results are MRI.  I assessed the patient myself.  He indicated that he longer feels dizzy.  He is still having headache/neck pain.  I have ordered morphine  for pain. I consulted neurology.  They recommended that if the MRI does not show any acute stroke, then patient will be discharged with aspirin  plus Plavix  and neuro follow-up.  Patient will need to be loaded on aspirin  and Plavix  in the ER.   Patient had no complains, no concerns from the nursing side. Will continue to monitor.   5:28 PM Patient is MRI results reveal remote cerebellar infarct.  No acute infarcts.  Patient remains asymptomatic from the perspective of any dizziness/vertigo at this time.  Discharge diagnosis and return precautions discussed with the patient.  Neurology referral has been placed.     Charlyn Sora, MD 01/31/24 [phone]

## 2024-02-02 ENCOUNTER — Encounter: Payer: Self-pay | Admitting: Neurology

## 2024-04-07 ENCOUNTER — Ambulatory Visit: Payer: Self-pay | Admitting: Neurology

## 2024-04-07 ENCOUNTER — Encounter: Payer: Self-pay | Admitting: Neurology

## 2024-04-07 VITALS — Ht 70.0 in | Wt 232.0 lb

## 2024-04-07 DIAGNOSIS — R55 Syncope and collapse: Secondary | ICD-10-CM | POA: Diagnosis not present

## 2024-04-07 DIAGNOSIS — F0781 Postconcussional syndrome: Secondary | ICD-10-CM

## 2024-04-07 DIAGNOSIS — I7774 Dissection of vertebral artery: Secondary | ICD-10-CM

## 2024-04-07 MED ORDER — AMITRIPTYLINE HCL 10 MG PO TABS: ORAL_TABLET | ORAL | 6 refills | Status: DC
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
## 2024-04-07 NOTE — Patient Instructions (Addendum)
 Good to meet you.  Schedule 1-hour EEG. If normal, we will do a 3-day home EEG  2. Start Amitriptyline  10mg : take 1 tablet every night for 1 week, then increase to 2 tablets every night. This is a medication used to help with post-concussion symptoms (headaches, sleep, mood changes)  3. Continue aspirin  and Plavix  daily, continue control of cholesterol, blood pressure, glucose levels  4. It is prudent to recommend that all persons should be free of syncopal (passing out) episodes for at least six months to be granted the driving privilege. (THE [place]  PHYSICIAN'S GUIDE TO DRIVER MEDICAL EVALUATION, Second Edition, Medical Review Branch, Associate Professor, Division of [employer], [place]  Department of Transportation, July 2004)  5. Follow-up in 3 months or earlier if needed, call for any changes

## 2024-04-09 ENCOUNTER — Ambulatory Visit: Admitting: Neurology

## 2024-04-09 DIAGNOSIS — R55 Syncope and collapse: Secondary | ICD-10-CM | POA: Diagnosis not present

## 2024-04-09 NOTE — Progress Notes (Unsigned)
 EEG complete and ready for review.

## 2024-04-11 NOTE — Procedures (Signed)
 ELECTROENCEPHALOGRAM REPORT  Date of Study: 04/09/2024  Patient's Name: Spencer Turner MRN: [id number] Date of Birth: 01/12/1971  Referring Provider: Dr. Darice Shivers  Clinical History: This is a 53 year old man with recurrent syncope. EEG for classification.  Medications: Amitriptyline , Plavix , aspirin , Crestor  Technical Summary: A multichannel digital EEG recording measured by the international 10-20 system with electrodes applied with paste and impedances below 5000 ohms performed in our laboratory with EKG monitoring in an awake and asleep patient.  Hyperventilation was not performed. Photic stimulation was performed.  The digital EEG was referentially recorded, reformatted, and digitally filtered in a variety of bipolar and referential montages for optimal display.    Description: The patient is awake and asleep during the recording.  During maximal wakefulness, there is a symmetric, medium voltage 10 Hz posterior dominant rhythm that attenuates with eye opening.  The record is symmetric.  During drowsiness and sleep, there is an increase in theta slowing of the background.  Vertex waves and symmetric sleep spindles were seen. Photic stimulation did not elicit any abnormalities.  There were no epileptiform discharges or electrographic seizures seen.    EKG lead was unremarkable.  Impression: This awake and asleep EEG is normal.    Clinical Correlation: A normal EEG does not exclude a clinical diagnosis of epilepsy.  If further clinical questions remain, prolonged EEG may be helpful.  Clinical correlation is advised.   Darice Shivers, M.D.

## 2024-04-12 ENCOUNTER — Other Ambulatory Visit: Payer: Self-pay | Admitting: *Deleted

## 2024-04-12 ENCOUNTER — Encounter: Payer: Self-pay | Admitting: Neurology

## 2024-04-12 ENCOUNTER — Ambulatory Visit: Payer: Self-pay | Admitting: Neurology

## 2024-04-12 DIAGNOSIS — R55 Syncope and collapse: Secondary | ICD-10-CM

## 2024-04-12 NOTE — Progress Notes (Signed)
 Notified pt with EEG results and ordered 3 day home EEG.

## 2024-04-12 NOTE — Progress Notes (Signed)
 LVM-to call the office back regarding EEG results.
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
## 2024-04-30 ENCOUNTER — Ambulatory Visit (INDEPENDENT_AMBULATORY_CARE_PROVIDER_SITE_OTHER): Admitting: Neurology

## 2024-04-30 DIAGNOSIS — R55 Syncope and collapse: Secondary | ICD-10-CM

## 2024-04-30 NOTE — Progress Notes (Signed)
 Ambulatory EEG hooked up and running. Light flashing. Push button tested. Camera and event log explained. Batteries explained. Patient understood.

## 2024-05-03 NOTE — Progress Notes (Signed)
 AMB EEG discontinued.  Skin Breakdown:No Diary Returned: Yes - with one event noted by patient and his wife. Patient reports one event 05/02/24 @ 2030 where he was confused for a few minutes. Afterwards patient had headache that kept him up until 0430 (05/03/24). He states he did take Tylenol  which eventually helped.

## 2024-05-17 NOTE — Procedures (Signed)
 ELECTROENCEPHALOGRAM REPORT  Dates of Recording: 04/30/2024 10:11AM to 05/03/2024 7:32AM  Patient's Name: Spencer Turner MRN: [id number] Date of Birth: 11/30/1970  Referring Provider: Dr. Darice Shivers  Procedure: 66:51-hour ambulatory video EEG  History: This is a 53 year old man with recurrent syncope. EEG for classification.   Medications: Amitriptyline , Plavix , aspirin , Crestor  Technical Summary: This is a 66:51-hour multichannel digital video EEG recording measured by the international 10-20 system with electrodes applied with paste and impedances below 5000 ohms performed as portable with EKG monitoring.  The digital EEG was referentially recorded, reformatted, and digitally filtered in a variety of bipolar and referential montages for optimal display.    DESCRIPTION OF RECORDING: During maximal wakefulness, the background activity consisted of a symmetric 10 Hz posterior dominant rhythm which was reactive to eye opening.  There were no epileptiform discharges or focal slowing seen in wakefulness.  During the recording, the patient progresses through wakefulness, drowsiness, and Stage 2 sleep.  Again, there were no epileptiform discharges seen.  Events: On 10/5 at 2035 hours, he did not push event button but reported he started having a headache with strange vision, moment of confusion. Headache kept him up until 10/6 0430 hours. Patient is seen sitting on the recliner, no clinical changes seen. Electrographically, there were no EEG or EKG changes seen.  There were no electrographic seizures seen.  EKG lead was unremarkable until EKG is obscured by artifact starting 10/5 at 2355 hours.  IMPRESSION: This 61:55-hour ambulatory video EEG study is normal.    CLINICAL CORRELATION: A normal EEG does not exclude a clinical diagnosis of epilepsy. Episode of headache, strange vision, confusion did not show EEG changes.  If further clinical questions remain, inpatient video EEG monitoring  may be helpful.   Darice Shivers, M.D.

## 2024-05-19 ENCOUNTER — Ambulatory Visit: Payer: Self-pay | Admitting: Neurology

## 2024-05-21 NOTE — Telephone Encounter (Signed)
 Pt called an informed prolonged EEG is normal, no seizure activity seen. How is he feeling with the amitriptyline ? He stated that he is only getting 2-3 hours of sleep. Has he had any more passing out since our last visit? He has only had 1 dizzy spell and no passing out,

## 2024-05-21 NOTE — Telephone Encounter (Signed)
-----   Message from Darice CHRISTELLA Shivers sent at 05/19/2024  9:34 AM EDT ----- Pls let him know the prolonged EEG is normal, no seizure activity seen. How is he feeling with the amitriptyline ? Has he had any more passing out since our last visit? Thanks ----- Message ----- From: Shivers Darice CHRISTELLA, MD Sent: 05/17/2024   9:38 AM EDT To: Darice CHRISTELLA Shivers, MD

## 2024-05-24 MED ORDER — AMITRIPTYLINE HCL 10 MG PO TABS: ORAL_TABLET | ORAL | 3 refills | Status: DC

## 2024-05-24 NOTE — Telephone Encounter (Signed)
-----   Message from Darice CHRISTELLA Shivers sent at 05/21/2024  1:51 PM EDT ----- Pls document as phone note so we can find easily when looking back.  Since still not sleeping, let's increase the amitriptyline  10mg : take 3 tabs at bedtime. Pls send in updated Rx. Thanks ----- Message ----- From: Taft Powell CHRISTELLA, LPN Sent: 89/75/7974   9:29 AM EDT To: Darice CHRISTELLA Shivers, MD  ----- Message from Powell CHRISTELLA Taft, LPN sent at 89/75/7974  9:29 AM EDT -----   ----- Message ----- From: Shivers Darice CHRISTELLA, MD Sent: 05/19/2024   9:34 AM EDT To: Powell CHRISTELLA Taft, LPN  Pls let him know the prolonged EEG is normal, no seizure activity seen. How is he feeling with the amitriptyline ? Has he had any more passing out since our last visit? Thanks ----- Message ----- From: Shivers Darice CHRISTELLA, MD Sent: 05/17/2024   9:38 AM EDT To: Darice CHRISTELLA Shivers, MD

## 2024-05-24 NOTE — Telephone Encounter (Signed)
 Pt called an informed that  the DMV does not go by test results, even if normal. They go by the last time he lost consciousness, it is 6 months from that.

## 2024-05-24 NOTE — Telephone Encounter (Signed)
 Pt called an informed that Since still not sleeping, let's increase the amitriptyline  10mg : take 3 tabs at bedtime new RX has been sent in  Pt is asking that since testing came back normal dose he still have to wait the full 6 months to drive

## 2024-05-24 NOTE — Telephone Encounter (Signed)
-----   Message from Spencer Turner sent at 05/24/2024  1:31 PM EDT ----- Pls let him know the DMV does not go by test results, even if normal. They go by the last time he lost consciousness, it is 6 months from that.  ----- Message ----- From: Spencer Powell CHRISTELLA, LPN Sent: 89/72/7974  12:21 PM EDT To: Spencer Turner Shivers, MD  ----- Message from Powell Turner Taft, LPN sent at 89/72/7974 12:21 PM EDT -----

## 2024-07-20 ENCOUNTER — Encounter: Payer: Self-pay | Admitting: Neurology

## 2024-07-20 ENCOUNTER — Ambulatory Visit: Admitting: Neurology

## 2024-07-20 VITALS — BP 114/78 | HR 66 | Ht 71.0 in | Wt 236.4 lb

## 2024-07-20 DIAGNOSIS — F0781 Postconcussional syndrome: Secondary | ICD-10-CM

## 2024-07-20 DIAGNOSIS — F32A Depression, unspecified: Secondary | ICD-10-CM | POA: Diagnosis not present

## 2024-07-20 DIAGNOSIS — R55 Syncope and collapse: Secondary | ICD-10-CM

## 2024-07-20 DIAGNOSIS — I7774 Dissection of vertebral artery: Secondary | ICD-10-CM

## 2024-07-20 MED ORDER — AMITRIPTYLINE HCL 50 MG PO TABS: 50.0000 mg | ORAL_TABLET | Freq: Every day | ORAL | 3 refills | Status: AC

## 2024-07-20 MED ORDER — CLOPIDOGREL BISULFATE 75 MG PO TABS: 75.0000 mg | ORAL_TABLET | Freq: Every day | ORAL | 3 refills | Status: AC

## 2024-07-20 NOTE — Patient Instructions (Signed)
 Good to see you.  Increase amitriptyline . With your current bottle of Amitriptyline  10mg : take 4 tablets every night. Once done, your new bottle is for Amitriptyline  50mg : take 1 tablet every night  2. Referral will be sent for a counselor to help with mood changes   3. Continue to monitor BP, stay hydrated. If passing out recurs, we will order a 30-day heart monitor  4. Continue aspirin  and Clopidogrel   5. It is prudent to recommend that all persons should be free of syncopal (passing out) episodes for at least six months to be granted the driving privilege. (THE [place]  PHYSICIAN'S GUIDE TO DRIVER MEDICAL EVALUATION, Second Edition, Medical Review Branch, Associate Professor, Division of [employer], [place]  Department of Transportation, July 2004)   6. Follow-up in 4 months, call for any changes
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
## 2024-07-21 NOTE — Addendum Note (Signed)
 Addended by: TAFT MOATS on: 07/21/2024 08:08 AM   Modules accepted: Orders

## 2024-11-17 ENCOUNTER — Ambulatory Visit: Payer: Self-pay | Admitting: Neurology
